# Patient Record
Sex: Male | Born: 1980 | Race: Black or African American | Hispanic: No | Marital: Married
Health system: Southern US, Community
[De-identification: ages and names within clinical notes are randomized; demographics above are authoritative.]

---
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
## 2000-08-16 ENCOUNTER — Emergency Department (HOSPITAL_COMMUNITY): Admission: EM | Admit: 2000-08-16 | Discharge: 2000-08-16 | Payer: Self-pay | Admitting: Emergency Medicine

## 2014-06-10 ENCOUNTER — Ambulatory Visit
Admission: RE | Admit: 2014-06-10 | Discharge: 2014-06-10 | Disposition: A | Discharge status: Home / Self Care | Payer: BC Managed Care – PPO | Source: Ambulatory Visit | Attending: Internal Medicine | Admitting: Internal Medicine

## 2014-06-10 ENCOUNTER — Other Ambulatory Visit: Payer: Self-pay | Admitting: Internal Medicine

## 2014-06-10 DIAGNOSIS — R0600 Dyspnea, unspecified: Secondary | ICD-10-CM

## 2016-03-02 IMAGING — CR DG CHEST 2V
2 series · 2 of 2 positions shown · non-contrast
Comparison: None.

CLINICAL DATA: Shortness of breath

EXAM:
CHEST  2 VIEW

[view not recorded (1 of 2)]
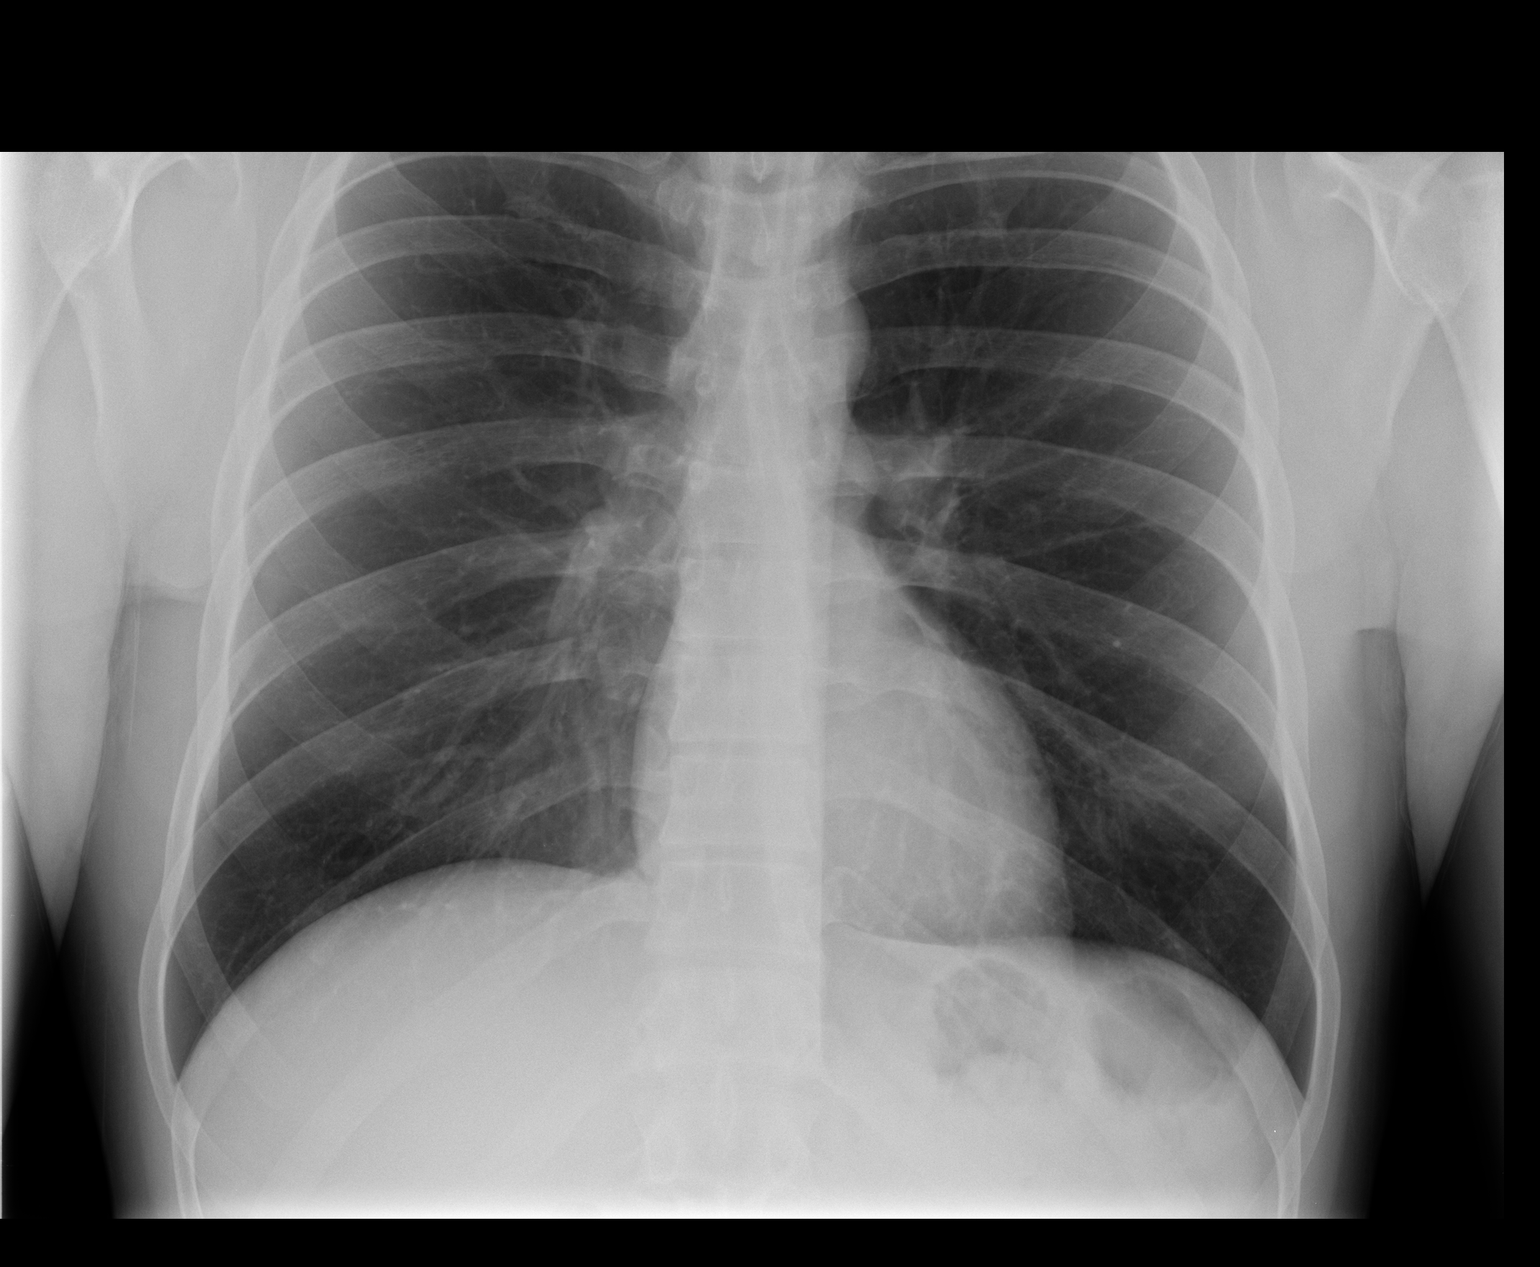

[view not recorded (2 of 2)]
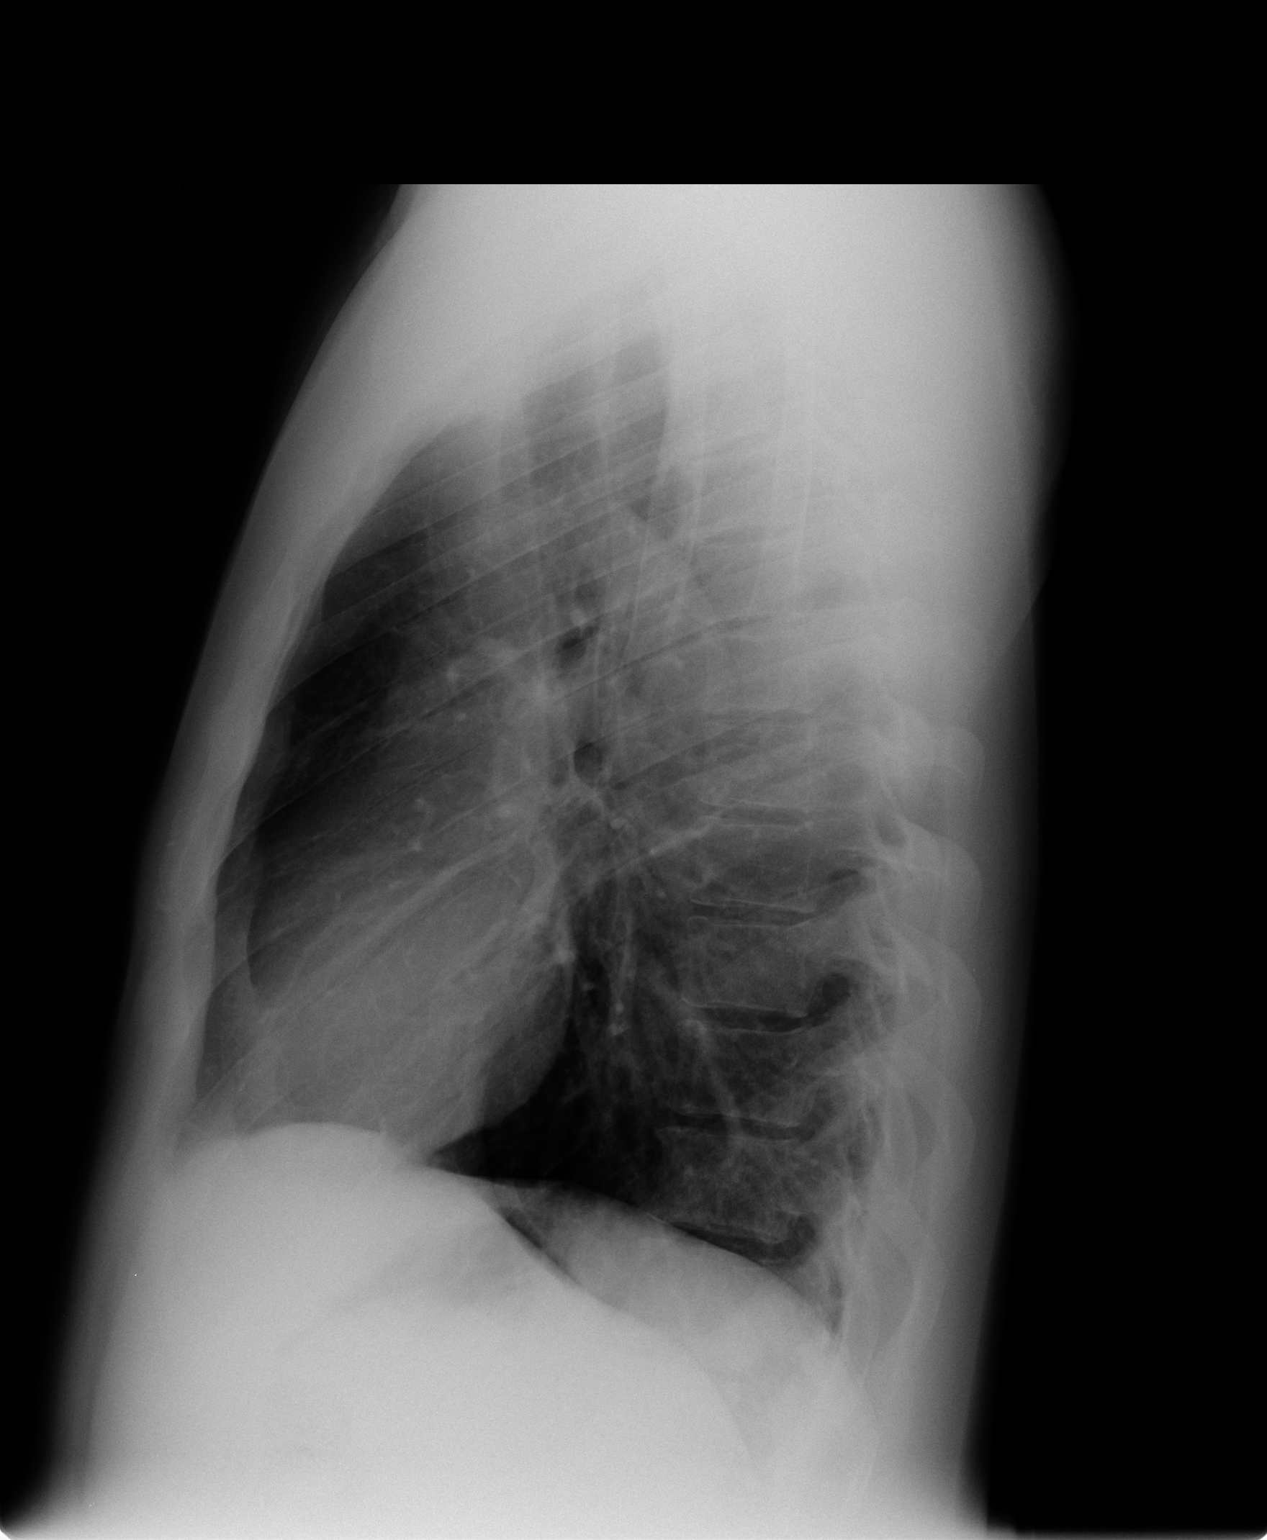

[2 of 2 positions shown; findings below may reference images not displayed]

FINDINGS: Normal cardiac and mediastinal contours. Lungs are clear. No pleural
effusion pneumothorax. Regional skeleton is unremarkable.
IMPRESSION: No acute cardiopulmonary process
